# Patient Record
Sex: Female | Born: 1970 | Hispanic: No | Marital: Single
Health system: Southern US, Community
[De-identification: ages and names within clinical notes are randomized; demographics above are authoritative.]

## PROBLEM LIST (undated history)

## (undated) DIAGNOSIS — N6452 Nipple discharge: Secondary | ICD-10-CM

---

## 1998-10-21 ENCOUNTER — Other Ambulatory Visit: Admission: RE | Admit: 1998-10-21 | Discharge: 1998-10-21 | Payer: Self-pay | Admitting: Obstetrics and Gynecology

## 2004-12-09 ENCOUNTER — Ambulatory Visit: Payer: Self-pay | Admitting: Obstetrics and Gynecology

## 2009-05-27 ENCOUNTER — Ambulatory Visit: Payer: Self-pay | Admitting: Obstetrics and Gynecology

## 2010-05-28 ENCOUNTER — Ambulatory Visit: Payer: Self-pay | Admitting: Obstetrics and Gynecology

## 2010-06-09 ENCOUNTER — Ambulatory Visit: Payer: Self-pay | Admitting: Obstetrics and Gynecology

## 2010-12-09 ENCOUNTER — Ambulatory Visit: Payer: Self-pay | Admitting: Obstetrics and Gynecology

## 2011-12-23 ENCOUNTER — Ambulatory Visit: Payer: Self-pay | Admitting: Obstetrics and Gynecology

## 2012-01-06 ENCOUNTER — Ambulatory Visit: Payer: Self-pay | Admitting: Obstetrics and Gynecology

## 2012-12-27 ENCOUNTER — Ambulatory Visit: Payer: Self-pay | Admitting: Obstetrics and Gynecology

## 2013-01-02 ENCOUNTER — Ambulatory Visit: Payer: Self-pay | Admitting: Obstetrics and Gynecology

## 2014-02-26 ENCOUNTER — Ambulatory Visit: Payer: Self-pay | Admitting: Obstetrics and Gynecology

## 2016-07-15 ENCOUNTER — Other Ambulatory Visit: Payer: Self-pay | Admitting: Obstetrics and Gynecology

## 2016-07-15 DIAGNOSIS — Z1239 Encounter for other screening for malignant neoplasm of breast: Secondary | ICD-10-CM

## 2016-08-25 ENCOUNTER — Ambulatory Visit: Payer: Self-pay

## 2016-09-14 ENCOUNTER — Ambulatory Visit
Admission: RE | Admit: 2016-09-14 | Discharge: 2016-09-14 | Disposition: A | Discharge status: Home / Self Care | Payer: BC Managed Care – PPO | Source: Ambulatory Visit | Attending: Obstetrics and Gynecology | Admitting: Obstetrics and Gynecology

## 2016-09-14 DIAGNOSIS — Z1239 Encounter for other screening for malignant neoplasm of breast: Secondary | ICD-10-CM | POA: Insufficient documentation

## 2016-09-14 DIAGNOSIS — N6452 Nipple discharge: Secondary | ICD-10-CM

## 2016-09-14 DIAGNOSIS — Z1231 Encounter for screening mammogram for malignant neoplasm of breast: Secondary | ICD-10-CM | POA: Diagnosis not present

## 2016-09-14 HISTORY — DX: Nipple discharge: N64.52

## 2016-09-17 ENCOUNTER — Other Ambulatory Visit: Payer: Self-pay | Admitting: Obstetrics and Gynecology

## 2016-09-17 DIAGNOSIS — R928 Other abnormal and inconclusive findings on diagnostic imaging of breast: Secondary | ICD-10-CM

## 2016-09-17 DIAGNOSIS — N6452 Nipple discharge: Secondary | ICD-10-CM

## 2016-09-28 ENCOUNTER — Ambulatory Visit
Admission: RE | Admit: 2016-09-28 | Discharge: 2016-09-28 | Disposition: A | Discharge status: Home / Self Care | Payer: BC Managed Care – PPO | Source: Ambulatory Visit | Attending: Obstetrics and Gynecology | Admitting: Obstetrics and Gynecology

## 2016-09-28 DIAGNOSIS — R928 Other abnormal and inconclusive findings on diagnostic imaging of breast: Secondary | ICD-10-CM

## 2016-09-28 DIAGNOSIS — N6452 Nipple discharge: Secondary | ICD-10-CM

## 2016-09-28 HISTORY — DX: Nipple discharge: N64.52

## 2017-11-21 ENCOUNTER — Other Ambulatory Visit: Payer: Self-pay | Admitting: Obstetrics and Gynecology

## 2017-11-21 DIAGNOSIS — Z1231 Encounter for screening mammogram for malignant neoplasm of breast: Secondary | ICD-10-CM

## 2017-11-30 ENCOUNTER — Inpatient Hospital Stay: Admission: RE | Admit: 2017-11-30 | Payer: BC Managed Care – PPO | Source: Ambulatory Visit

## 2017-12-21 ENCOUNTER — Ambulatory Visit
Admission: RE | Admit: 2017-12-21 | Discharge: 2017-12-21 | Disposition: A | Discharge status: Home / Self Care | Payer: BC Managed Care – PPO | Source: Ambulatory Visit | Attending: Obstetrics and Gynecology | Admitting: Obstetrics and Gynecology

## 2017-12-21 ENCOUNTER — Encounter (INDEPENDENT_AMBULATORY_CARE_PROVIDER_SITE_OTHER): Payer: Self-pay

## 2017-12-21 DIAGNOSIS — Z1231 Encounter for screening mammogram for malignant neoplasm of breast: Secondary | ICD-10-CM | POA: Insufficient documentation

## 2018-09-29 ENCOUNTER — Other Ambulatory Visit: Payer: Self-pay | Admitting: Obstetrics and Gynecology

## 2018-09-29 DIAGNOSIS — Z1231 Encounter for screening mammogram for malignant neoplasm of breast: Secondary | ICD-10-CM

## 2018-12-27 ENCOUNTER — Ambulatory Visit: Payer: BC Managed Care – PPO

## 2019-01-16 ENCOUNTER — Other Ambulatory Visit: Payer: Self-pay

## 2019-01-16 ENCOUNTER — Ambulatory Visit
Admission: RE | Admit: 2019-01-16 | Discharge: 2019-01-16 | Disposition: A | Discharge status: Home / Self Care | Payer: BC Managed Care – PPO | Source: Ambulatory Visit | Attending: Obstetrics and Gynecology | Admitting: Obstetrics and Gynecology

## 2019-01-16 ENCOUNTER — Encounter (INDEPENDENT_AMBULATORY_CARE_PROVIDER_SITE_OTHER): Payer: Self-pay

## 2019-01-16 DIAGNOSIS — Z1231 Encounter for screening mammogram for malignant neoplasm of breast: Secondary | ICD-10-CM

## 2019-01-22 ENCOUNTER — Other Ambulatory Visit: Payer: Self-pay | Admitting: Obstetrics and Gynecology

## 2019-01-22 DIAGNOSIS — R928 Other abnormal and inconclusive findings on diagnostic imaging of breast: Secondary | ICD-10-CM

## 2019-01-30 ENCOUNTER — Ambulatory Visit
Admission: RE | Admit: 2019-01-30 | Discharge: 2019-01-30 | Disposition: A | Discharge status: Home / Self Care | Payer: BC Managed Care – PPO | Source: Ambulatory Visit | Attending: Obstetrics and Gynecology | Admitting: Obstetrics and Gynecology

## 2019-01-30 DIAGNOSIS — R928 Other abnormal and inconclusive findings on diagnostic imaging of breast: Secondary | ICD-10-CM

## 2019-01-31 ENCOUNTER — Other Ambulatory Visit: Payer: Self-pay | Admitting: Obstetrics and Gynecology

## 2019-01-31 DIAGNOSIS — N6310 Unspecified lump in the right breast, unspecified quadrant: Secondary | ICD-10-CM

## 2019-01-31 DIAGNOSIS — R928 Other abnormal and inconclusive findings on diagnostic imaging of breast: Secondary | ICD-10-CM

## 2019-07-06 ENCOUNTER — Ambulatory Visit: Payer: BC Managed Care – PPO | Attending: Internal Medicine

## 2019-07-06 DIAGNOSIS — Z23 Encounter for immunization: Secondary | ICD-10-CM | POA: Insufficient documentation

## 2019-07-06 NOTE — Progress Notes (Signed)
   Covid-19 Vaccination Clinic  Name:  Erin Wood    MRN: [id number] DOB: Nov 24, 1970  07/06/2019  [name] was observed post Covid-19 immunization for 15 minutes without incidence. She was provided with Vaccine Information Sheet and instruction to access the V-Safe system.   [name] was instructed to call 911 with any severe reactions post vaccine: Marland Kitchen Difficulty breathing  . Swelling of your face and throat  . A fast heartbeat  . A bad rash all over your body  . Dizziness and weakness    Immunizations Administered    Name Date Dose VIS Date Route   Pfizer COVID-19 Vaccine 07/06/2019  8:34 AM 0.3 mL 04/20/2019 Intramuscular   Manufacturer: [employer], [employer]   Lot: [id number]   NDC: [address]-6

## 2019-07-07 ENCOUNTER — Ambulatory Visit: Payer: BC Managed Care – PPO

## 2019-07-31 ENCOUNTER — Ambulatory Visit: Payer: BC Managed Care – PPO | Attending: Internal Medicine

## 2019-07-31 DIAGNOSIS — Z23 Encounter for immunization: Secondary | ICD-10-CM

## 2019-07-31 NOTE — Progress Notes (Signed)
   Covid-19 Vaccination Clinic  Name:  Erin Wood    MRN: [id number] DOB: 13-Dec-1970  07/31/2019  Erin Wood was observed post Covid-19 immunization for 15 minutes without incident. She was provided with Vaccine Information Sheet and instruction to access the V-Safe system.   Erin Wood was instructed to call 911 with any severe reactions post vaccine: Marland Kitchen Difficulty breathing  . Swelling of face and throat  . A fast heartbeat  . A bad rash all over body  . Dizziness and weakness   Immunizations Administered    Name Date Dose VIS Date Route   Pfizer COVID-19 Vaccine 07/31/2019  3:31 PM 0.3 mL 04/20/2019 Intramuscular   Manufacturer: [employer], [employer]   Lot: [id number]   NDC: [address]-4

## 2019-08-07 ENCOUNTER — Other Ambulatory Visit: Payer: BC Managed Care – PPO

## 2019-08-10 ENCOUNTER — Ambulatory Visit
Admission: RE | Admit: 2019-08-10 | Discharge: 2019-08-10 | Disposition: A | Discharge status: Home / Self Care | Payer: BC Managed Care – PPO | Source: Ambulatory Visit | Attending: Obstetrics and Gynecology | Admitting: Obstetrics and Gynecology

## 2019-08-10 DIAGNOSIS — N6310 Unspecified lump in the right breast, unspecified quadrant: Secondary | ICD-10-CM | POA: Diagnosis present

## 2019-08-10 DIAGNOSIS — R928 Other abnormal and inconclusive findings on diagnostic imaging of breast: Secondary | ICD-10-CM | POA: Insufficient documentation

## 2019-08-13 ENCOUNTER — Other Ambulatory Visit: Payer: Self-pay | Admitting: Obstetrics and Gynecology

## 2019-08-13 DIAGNOSIS — N6310 Unspecified lump in the right breast, unspecified quadrant: Secondary | ICD-10-CM

## 2020-11-17 ENCOUNTER — Other Ambulatory Visit: Payer: Self-pay | Admitting: Obstetrics and Gynecology

## 2020-11-17 DIAGNOSIS — Z1231 Encounter for screening mammogram for malignant neoplasm of breast: Secondary | ICD-10-CM

## 2021-04-07 ENCOUNTER — Other Ambulatory Visit: Payer: Self-pay | Admitting: Obstetrics and Gynecology

## 2021-04-07 DIAGNOSIS — N6310 Unspecified lump in the right breast, unspecified quadrant: Secondary | ICD-10-CM

## 2021-05-13 ENCOUNTER — Other Ambulatory Visit: Payer: Self-pay

## 2021-05-13 ENCOUNTER — Ambulatory Visit
Admission: RE | Admit: 2021-05-13 | Discharge: 2021-05-13 | Disposition: A | Discharge status: Home / Self Care | Payer: BC Managed Care – PPO | Source: Ambulatory Visit | Attending: Obstetrics and Gynecology | Admitting: Obstetrics and Gynecology

## 2021-05-13 DIAGNOSIS — N6310 Unspecified lump in the right breast, unspecified quadrant: Secondary | ICD-10-CM | POA: Diagnosis present

## 2022-03-10 ENCOUNTER — Other Ambulatory Visit: Payer: Self-pay | Admitting: Obstetrics and Gynecology

## 2022-03-10 DIAGNOSIS — Z1231 Encounter for screening mammogram for malignant neoplasm of breast: Secondary | ICD-10-CM

## 2022-06-17 ENCOUNTER — Ambulatory Visit
Admission: RE | Admit: 2022-06-17 | Discharge: 2022-06-17 | Disposition: A | Discharge status: Home / Self Care | Payer: BC Managed Care – PPO | Source: Ambulatory Visit | Attending: Obstetrics and Gynecology | Admitting: Obstetrics and Gynecology

## 2022-06-17 DIAGNOSIS — Z1231 Encounter for screening mammogram for malignant neoplasm of breast: Secondary | ICD-10-CM | POA: Diagnosis not present

## 2023-08-21 IMAGING — US US BREAST*R* LIMITED INC AXILLA
2 series · 12 of 15 positions shown · non-contrast
Comparison: Previous exam(s).

CLINICAL DATA: BI-RADS 3 follow-up of RIGHT breast mass initiated
January 2019. History of cysts.

EXAM:
DIGITAL DIAGNOSTIC BILATERAL MAMMOGRAM WITH TOMOSYNTHESIS AND CAD;
ULTRASOUND RIGHT BREAST LIMITED
TECHNIQUE: Bilateral digital diagnostic mammography and breast tomosynthesis
was performed. The images were evaluated with computer-aided
detection.; Targeted ultrasound examination of the right breast was
performed

[Series 1: us breast*right* limited inc axilla · 0.05mm/px · 8 of 10 slices shown (1 of 2)]
[im 1/10]
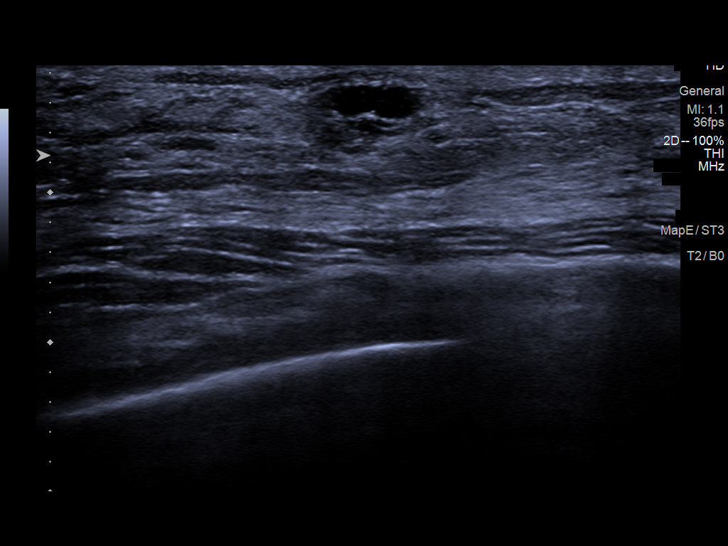
[im 2/10]
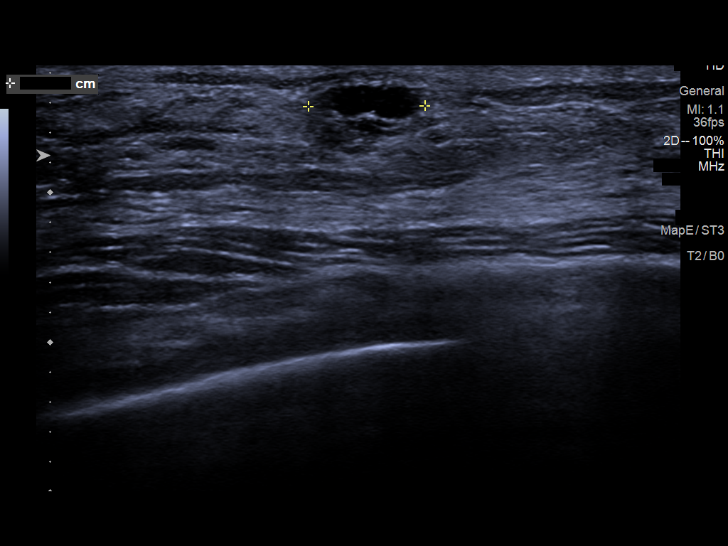
[im 4/10]
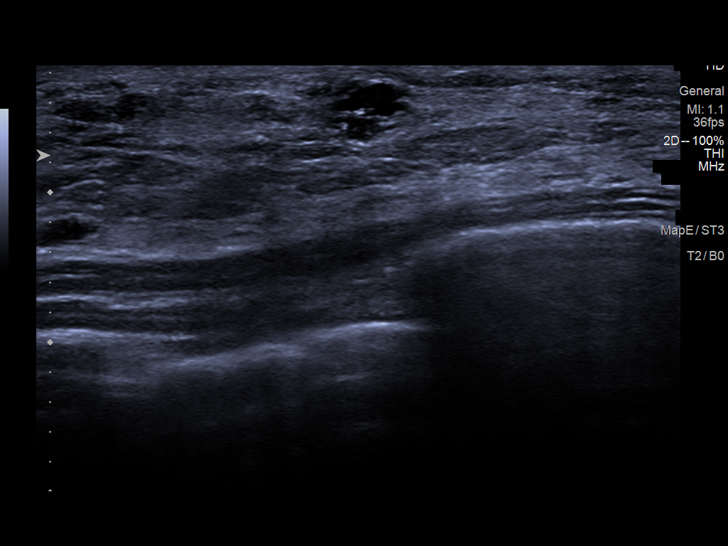
[im 5/10]
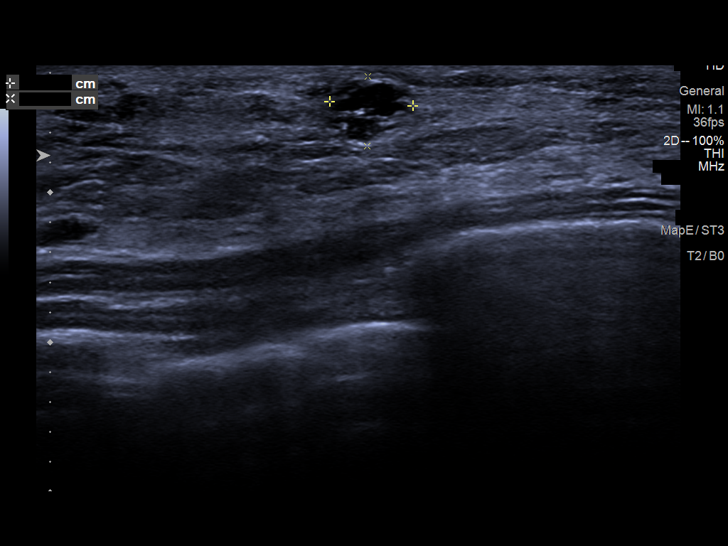
[im 6/10]
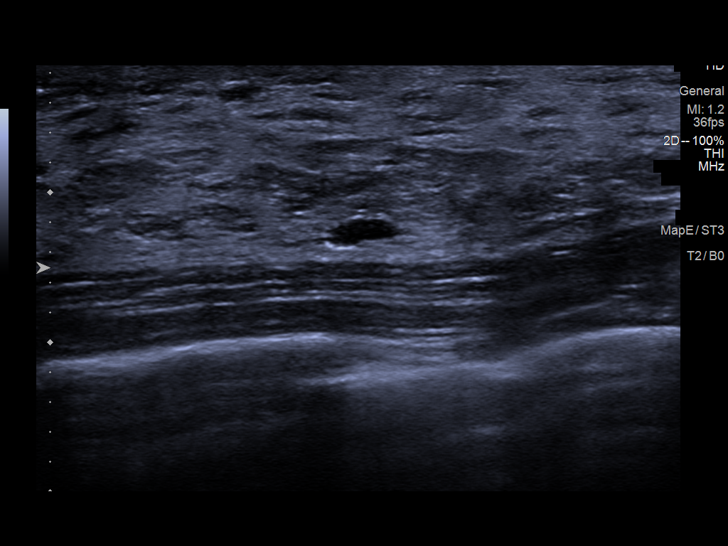
[im 7/10]
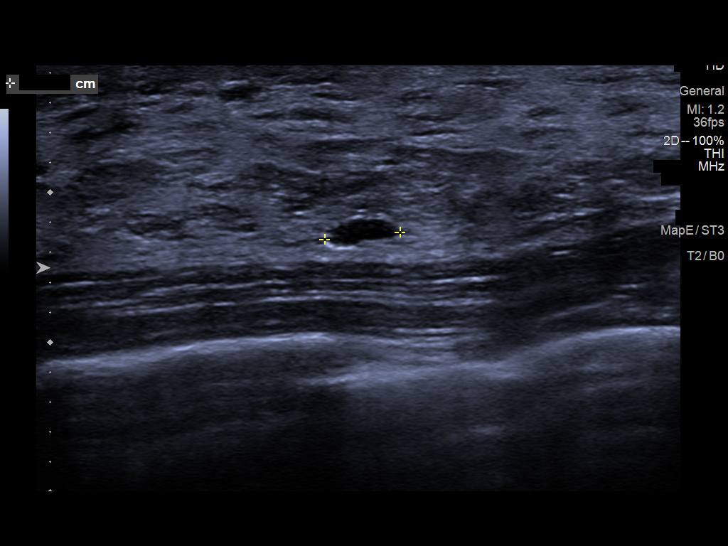
[im 9/10]
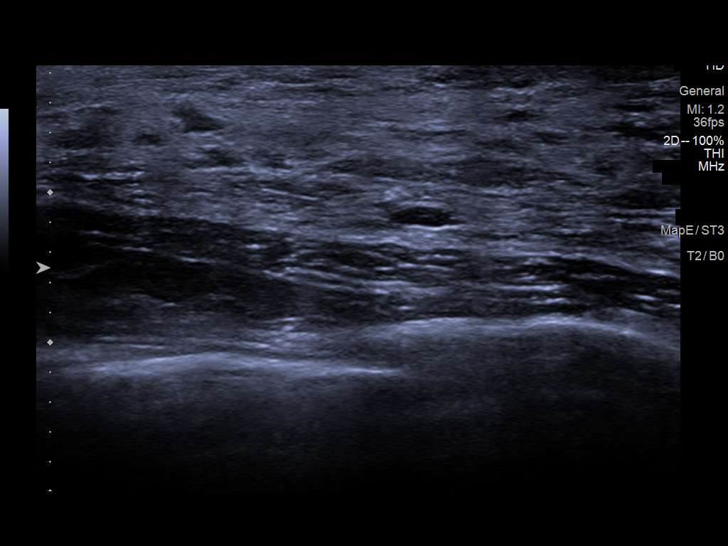
[im 10/10]
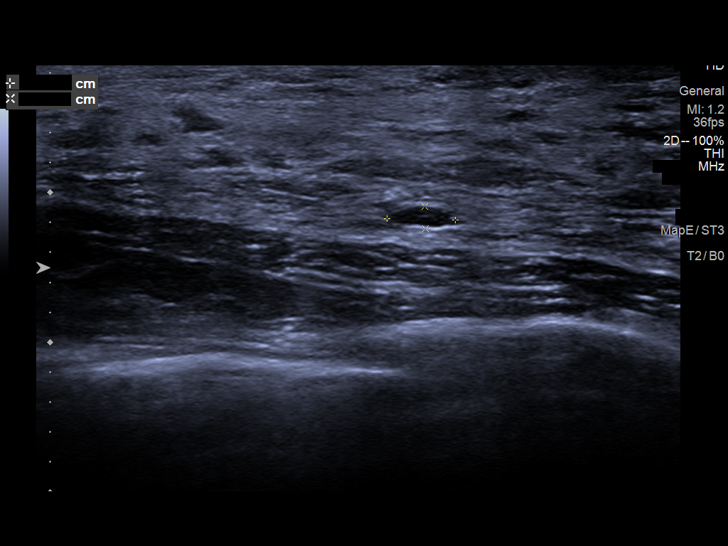

[Series 2: us breast*right* limited inc axilla · 0.06mm/px · 4 of 5 slices shown (2 of 2)]
[im 1/5]
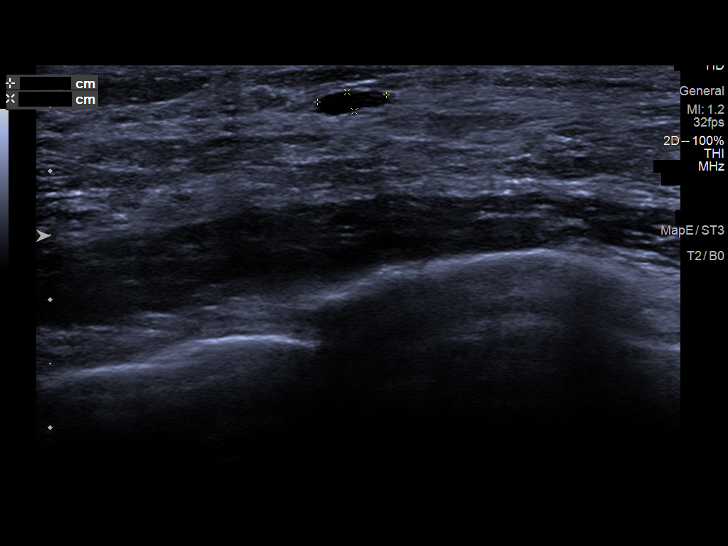
[im 2/5]
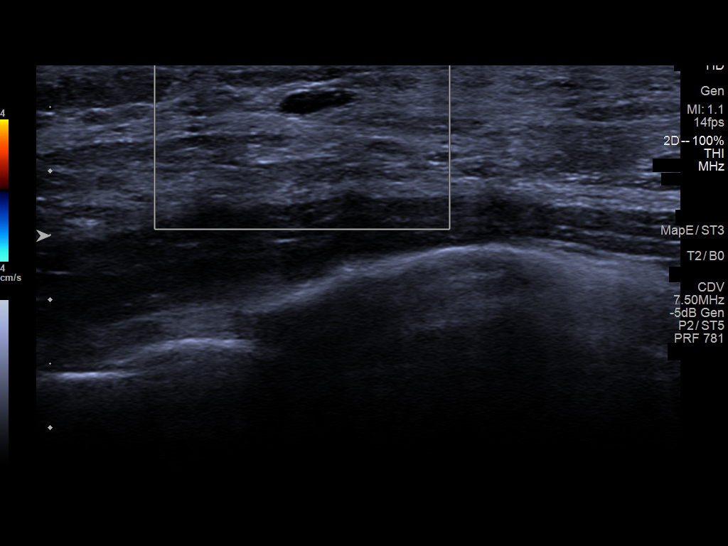
[im 4/5]
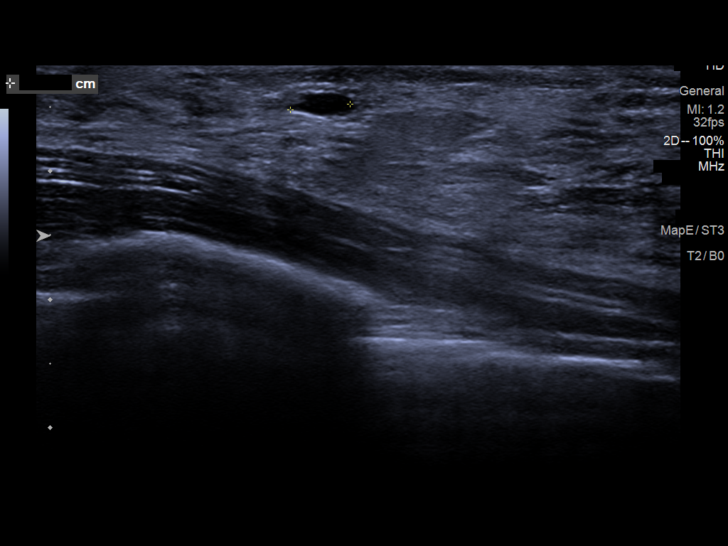
[im 5/5]
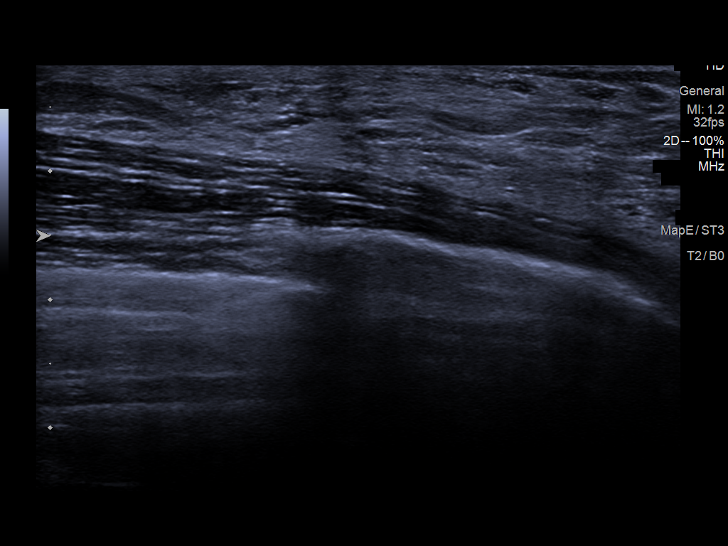

[12 of 15 positions shown; findings below may reference images not displayed]

ACR Breast Density Category d: The breast tissue is extremely dense,
which lowers the sensitivity of mammography.
FINDINGS: No suspicious mass, distortion, or microcalcifications are
identified to suggest presence of malignancy in the LEFT breast.
Questioned asymmetries in the RIGHT upper outer breast resolve with
additional views, most consistent with overlapping tissue.
Previously questioned RIGHT breast distortion is not visualized. No
suspicious mass, distortion, or microcalcifications are identified
to suggest presence of malignancy.

On physical exam, no suspicious mass is appreciated.

Targeted ultrasound was performed of the RIGHT lower breast. At 6
o'clock 1 cm from the nipple, there is revisualization of an oval
circumscribed anechoic mass with posterior acoustic enhancement and
a thin internal septation. It measures 6 x 5 x 8 mm, previously 8 x
8 x 4 mm. This is consistent with a benign cluster of cysts. An
additional simple cyst is noted at 7 o'clock 1 cm from the nipple
which measures 5 by 5 x 2 mm.

Targeted ultrasound was performed of the RIGHT upper outer breast at
the site of asymmetry concern. No suspicious cystic or solid mass is
seen. Innumerable simple and minimally complicated cysts are noted.
Representative simple cyst is documented at 11 o'clock 3 cm from the
nipple which measures 5 x 2 x 5 mm.
IMPRESSION: 1. Stable to decreased size of a mass in the RIGHT breast at 6
o'clock 1 cm from the nipple for greater than 2 years, consistent
with a benign cluster of cysts.
2. There are multiple additional benign cysts within the RIGHT
breast.
3. No mammographic evidence of malignancy bilaterally.

RECOMMENDATION:
Screening mammogram in one year.(Code:H5-1-QJH)

Given extreme breast density, supplemental screening with screening
bilateral breast ultrasound or screening bilateral breast MRI with
and without contrast could be considered. The American [employer]
Society recommends annual MRI and mammography in patients with an
estimated lifetime risk of developing breast cancer greater than 20
- 25%, or who are known or suspected to be positive for the breast
cancer gene.

I have discussed the findings and recommendations with the patient.
If applicable, a reminder letter will be sent to the patient
regarding the next appointment.

BI-RADS CATEGORY  2: Benign.

## 2023-08-21 IMAGING — MG DIGITAL DIAGNOSTIC BILAT W/ TOMO W/ CAD
8 of 15 series · 8 of 40 positions shown · non-contrast
Comparison: Previous exam(s).

CLINICAL DATA: BI-RADS 3 follow-up of RIGHT breast mass initiated
January 2019. History of cysts.

EXAM:
DIGITAL DIAGNOSTIC BILATERAL MAMMOGRAM WITH TOMOSYNTHESIS AND CAD;
ULTRASOUND RIGHT BREAST LIMITED
TECHNIQUE: Bilateral digital diagnostic mammography and breast tomosynthesis
was performed. The images were evaluated with computer-aided
detection.; Targeted ultrasound examination of the right breast was
performed

[R MLO synth-2D (1 of 2)]
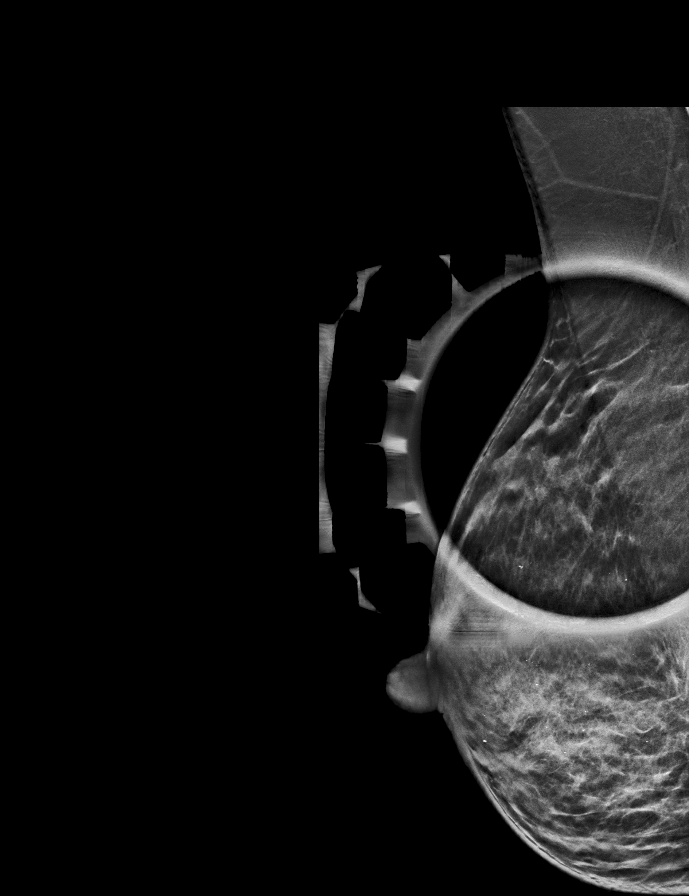

[L CC synth-2D (1 of 2)]
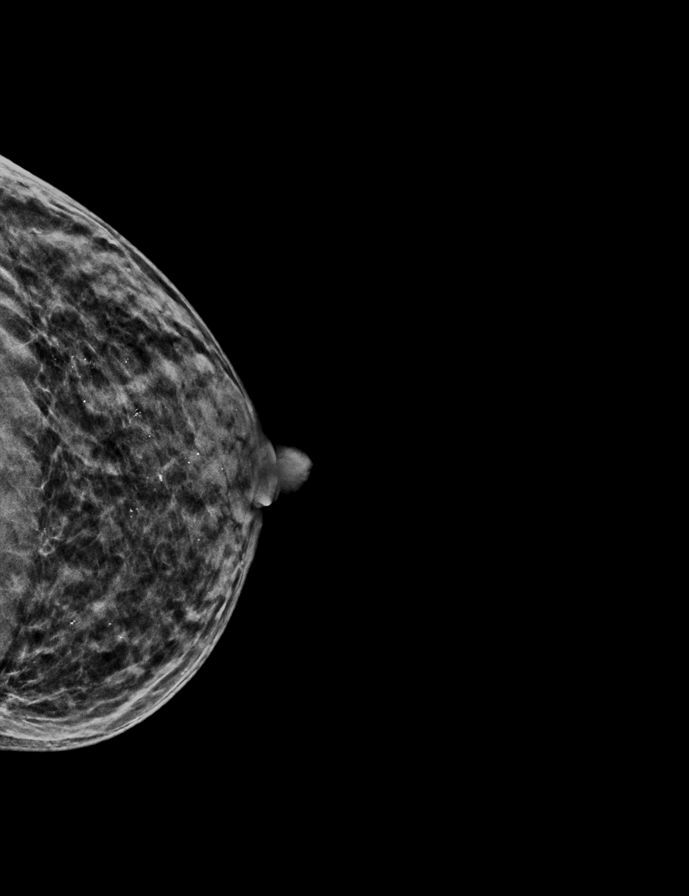

[R ML synth-2D]
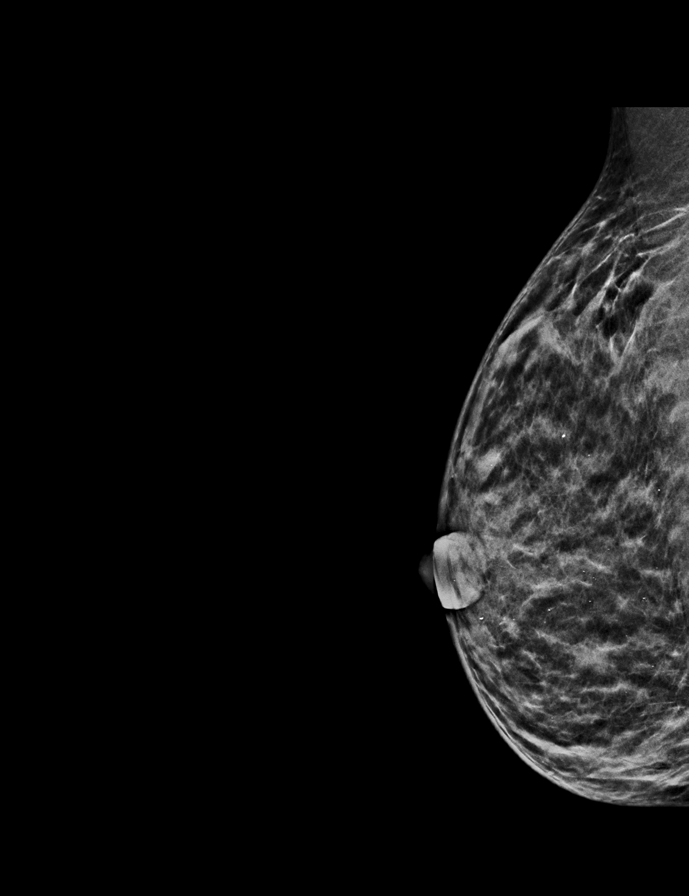

[R CC synth-2D (1 of 2)]
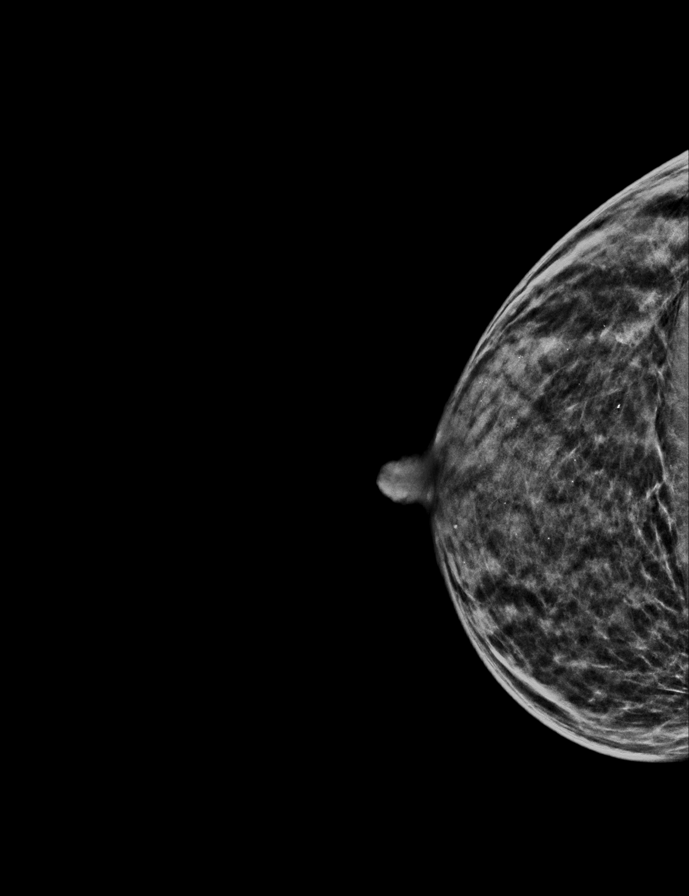

[L CC synth-2D (2 of 2)]
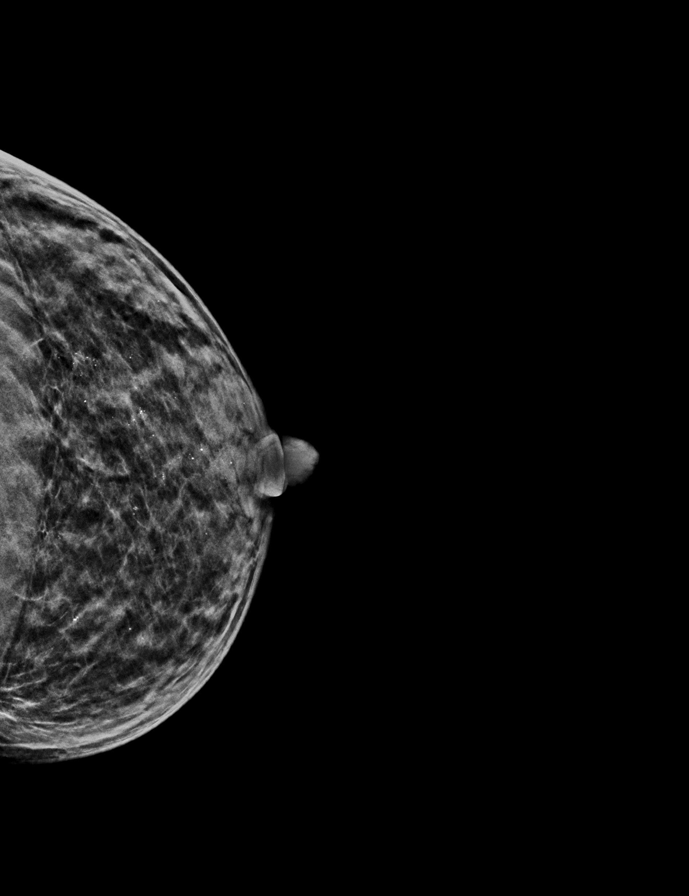

[R CC synth-2D (2 of 2)]
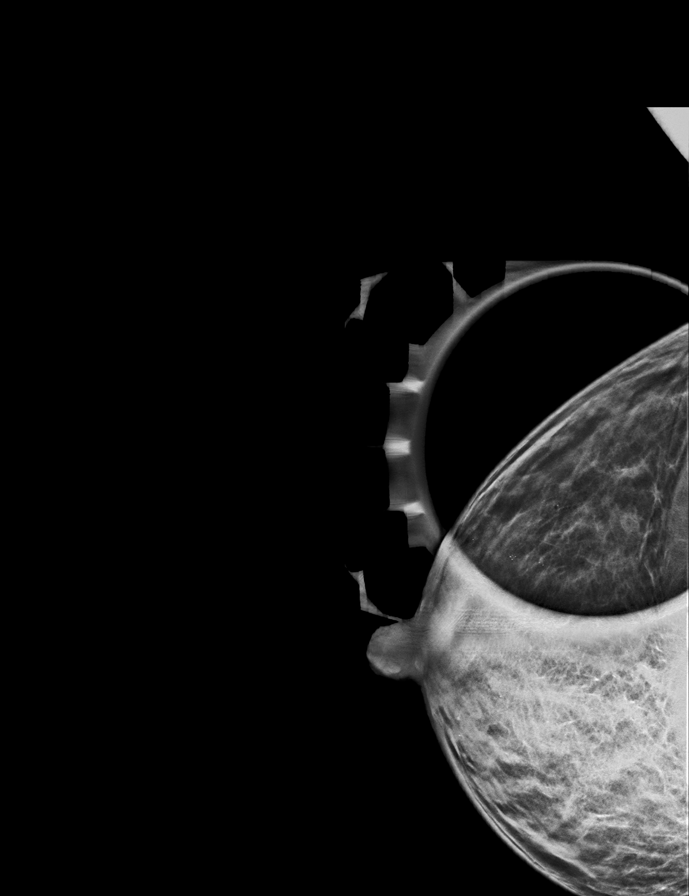

[R MLO synth-2D (2 of 2)]
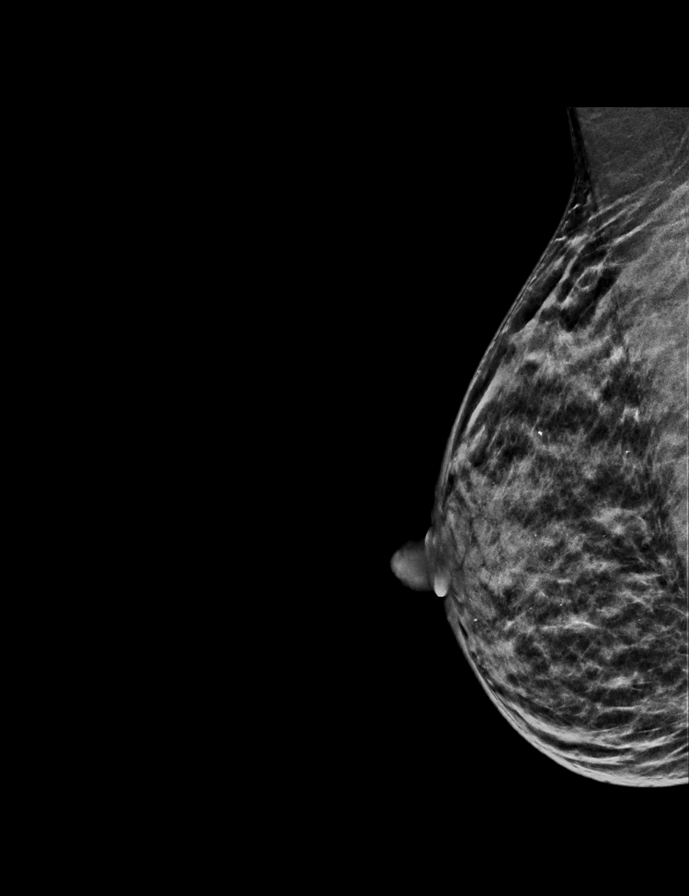

[L CC tomo · tomo slice 28/41.0]
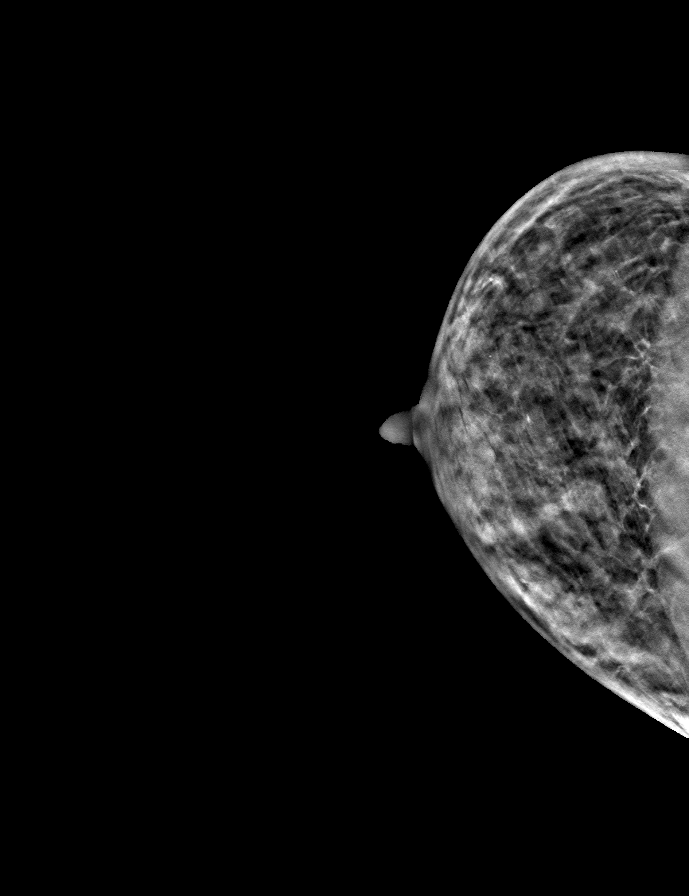

[8 of 40 positions shown; findings below may reference images not displayed]

ACR Breast Density Category d: The breast tissue is extremely dense,
which lowers the sensitivity of mammography.
FINDINGS: No suspicious mass, distortion, or microcalcifications are
identified to suggest presence of malignancy in the LEFT breast.
Questioned asymmetries in the RIGHT upper outer breast resolve with
additional views, most consistent with overlapping tissue.
Previously questioned RIGHT breast distortion is not visualized. No
suspicious mass, distortion, or microcalcifications are identified
to suggest presence of malignancy.

On physical exam, no suspicious mass is appreciated.

Targeted ultrasound was performed of the RIGHT lower breast. At 6
o'clock 1 cm from the nipple, there is revisualization of an oval
circumscribed anechoic mass with posterior acoustic enhancement and
a thin internal septation. It measures 6 x 5 x 8 mm, previously 8 x
8 x 4 mm. This is consistent with a benign cluster of cysts. An
additional simple cyst is noted at 7 o'clock 1 cm from the nipple
which measures 5 by 5 x 2 mm.

Targeted ultrasound was performed of the RIGHT upper outer breast at
the site of asymmetry concern. No suspicious cystic or solid mass is
seen. Innumerable simple and minimally complicated cysts are noted.
Representative simple cyst is documented at 11 o'clock 3 cm from the
nipple which measures 5 x 2 x 5 mm.
IMPRESSION: 1. Stable to decreased size of a mass in the RIGHT breast at 6
o'clock 1 cm from the nipple for greater than 2 years, consistent
with a benign cluster of cysts.
2. There are multiple additional benign cysts within the RIGHT
breast.
3. No mammographic evidence of malignancy bilaterally.

RECOMMENDATION:
Screening mammogram in one year.(Code:H5-1-QJH)

Given extreme breast density, supplemental screening with screening
bilateral breast ultrasound or screening bilateral breast MRI with
and without contrast could be considered. The American Cancer
Society recommends annual MRI and mammography in patients with an
estimated lifetime risk of developing breast cancer greater than 20
- 25%, or who are known or suspected to be positive for the breast
cancer gene.

I have discussed the findings and recommendations with the patient.
If applicable, a reminder letter will be sent to the patient
regarding the next appointment.

BI-RADS CATEGORY  2: Benign.
